# Patient Record
Sex: Male | Born: 1980 | Race: White | Hispanic: No | Marital: Single | Smoking: Former smoker
Health system: Southern US, Community
[De-identification: ages and names within clinical notes are randomized; demographics above are authoritative.]

---

## 2003-04-11 ENCOUNTER — Emergency Department (HOSPITAL_COMMUNITY): Admission: EM | Admit: 2003-04-11 | Discharge: 2003-04-11 | Payer: Self-pay | Admitting: Emergency Medicine

## 2007-04-26 ENCOUNTER — Emergency Department (HOSPITAL_COMMUNITY): Admission: EM | Admit: 2007-04-26 | Discharge: 2007-04-26 | Payer: Self-pay | Admitting: Emergency Medicine

## 2014-09-16 ENCOUNTER — Encounter (HOSPITAL_COMMUNITY): Payer: Self-pay | Admitting: *Deleted

## 2014-09-16 ENCOUNTER — Emergency Department (HOSPITAL_COMMUNITY)
Admission: EM | Admit: 2014-09-16 | Discharge: 2014-09-16 | Disposition: A | Discharge status: Home / Self Care | Payer: Self-pay | Attending: Emergency Medicine | Admitting: Emergency Medicine

## 2014-09-16 DIAGNOSIS — T1490XA Injury, unspecified, initial encounter: Secondary | ICD-10-CM

## 2014-09-16 DIAGNOSIS — Y998 Other external cause status: Secondary | ICD-10-CM | POA: Insufficient documentation

## 2014-09-16 DIAGNOSIS — Y92410 Unspecified street and highway as the place of occurrence of the external cause: Secondary | ICD-10-CM | POA: Insufficient documentation

## 2014-09-16 DIAGNOSIS — S24109A Unspecified injury at unspecified level of thoracic spinal cord, initial encounter: Secondary | ICD-10-CM | POA: Insufficient documentation

## 2014-09-16 DIAGNOSIS — Y9389 Activity, other specified: Secondary | ICD-10-CM | POA: Insufficient documentation

## 2014-09-16 DIAGNOSIS — S29001A Unspecified injury of muscle and tendon of front wall of thorax, initial encounter: Secondary | ICD-10-CM | POA: Insufficient documentation

## 2014-09-16 DIAGNOSIS — S3992XA Unspecified injury of lower back, initial encounter: Secondary | ICD-10-CM | POA: Insufficient documentation

## 2014-09-16 DIAGNOSIS — F1010 Alcohol abuse, uncomplicated: Secondary | ICD-10-CM | POA: Insufficient documentation

## 2014-09-16 LAB — CBC WITH DIFFERENTIAL/PLATELET
Basophils Absolute: 0 10*3/uL (ref 0.0–0.1)
Basophils Relative: 0 % (ref 0–1)
EOS ABS: 0.1 10*3/uL (ref 0.0–0.7)
Eosinophils Relative: 2 % (ref 0–5)
HEMATOCRIT: 46 % (ref 39.0–52.0)
HEMOGLOBIN: 16.3 g/dL (ref 13.0–17.0)
LYMPHS PCT: 30 % (ref 12–46)
Lymphs Abs: 2.2 10*3/uL (ref 0.7–4.0)
MCH: 32 pg (ref 26.0–34.0)
MCHC: 35.4 g/dL (ref 30.0–36.0)
MCV: 90.4 fL (ref 78.0–100.0)
Monocytes Absolute: 0.4 10*3/uL (ref 0.1–1.0)
Monocytes Relative: 5 % (ref 3–12)
NEUTROS ABS: 4.7 10*3/uL (ref 1.7–7.7)
NEUTROS PCT: 63 % (ref 43–77)
PLATELETS: 241 10*3/uL (ref 150–400)
RBC: 5.09 MIL/uL (ref 4.22–5.81)
RDW: 12.1 % (ref 11.5–15.5)
WBC: 7.5 10*3/uL (ref 4.0–10.5)

## 2014-09-16 LAB — I-STAT CHEM 8, ED
BUN: 10 mg/dL (ref 6–23)
CHLORIDE: 105 meq/L (ref 96–112)
Calcium, Ion: 1.15 mmol/L (ref 1.12–1.23)
Creatinine, Ser: 1.3 mg/dL (ref 0.50–1.35)
Glucose, Bld: 107 mg/dL — ABNORMAL HIGH (ref 70–99)
HCT: 50 % (ref 39.0–52.0)
HEMOGLOBIN: 17 g/dL (ref 13.0–17.0)
POTASSIUM: 3.8 mmol/L (ref 3.5–5.1)
Sodium: 141 mmol/L (ref 135–145)
TCO2: 20 mmol/L (ref 0–100)

## 2014-09-16 LAB — ETHANOL: ALCOHOL ETHYL (B): 281 mg/dL — AB (ref 0–9)
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
## 2014-09-16 NOTE — ED Notes (Signed)
CT called RN to see if IV had been started. Pt. Still on phone with dad and would not get off phone to get undressed so RN could start IV. RN asked pt. To undress again.
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
## 2014-09-16 NOTE — ED Notes (Addendum)
[employer] banker, operationaltate Police Officer at bedside.

## 2014-09-16 NOTE — ED Notes (Signed)
EMT helped pt. Get undressed and into gown. RN went into pt. Room to start IV for CT. Pt. Was dressed and wanted to leave AMA. RN informed MD and MD convinced pt. To stay. Pt. Wanted to call dad. RN assisted pt. With phone call.

## 2014-09-16 NOTE — ED Notes (Signed)
Pt. Left with all belongings and refused wheelchair 

## 2017-05-27 ENCOUNTER — Encounter (HOSPITAL_BASED_OUTPATIENT_CLINIC_OR_DEPARTMENT_OTHER): Payer: Self-pay | Admitting: Emergency Medicine

## 2017-05-27 ENCOUNTER — Emergency Department (HOSPITAL_BASED_OUTPATIENT_CLINIC_OR_DEPARTMENT_OTHER)
Admission: EM | Admit: 2017-05-27 | Discharge: 2017-05-27 | Disposition: A | Discharge status: Home / Self Care | Payer: Self-pay | Attending: Emergency Medicine | Admitting: Emergency Medicine

## 2017-05-27 DIAGNOSIS — M5412 Radiculopathy, cervical region: Secondary | ICD-10-CM | POA: Insufficient documentation

## 2017-05-27 MED ORDER — PREDNISONE 10 MG PO TABS: 40.0000 mg | ORAL_TABLET | Freq: Every day | ORAL | 0 refills | Status: DC

## 2017-05-27 MED ORDER — PREDNISONE 50 MG PO TABS
60.0000 mg | ORAL_TABLET | Freq: Once | ORAL | Status: AC
  Administered 2017-05-27: 60 mg via ORAL
  Filled 2017-05-27: qty 1

## 2017-05-27 NOTE — ED Provider Notes (Signed)
[facility]   CSN: [id number] Arrival date & time: 05/27/17  1634     History   Chief Complaint Chief Complaint  Patient presents with  . Numbness    HPI Austin Huang is a 36 y.o. male.  Patient presents with a bilateral numbness to both hands ring finger little finger and weakness with grip for the past 2 days. 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 Patient denies any lower extremity weakness or numbness. Denies any neck pain back pain headache. No trouble speaking no visual changes. No history of any injury. 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 Patient does talk about some numbness to that area intermittently in the past but always resolved quickly.      History reviewed. No pertinent past medical history.  There are no active problems to display for this patient.   History reviewed. No pertinent surgical history.     Home Medications    Prior to Admission medications   Medication Sig Start Date End Date Taking? Authorizing Provider  predniSONE (DELTASONE) 10 MG tablet Take 4 tablets (40 mg total) by mouth daily. 05/27/17   Vanetta Mulders, MD    Family History No family history on file.  Social History Social History  Substance Use Topics  . Smoking status: Former Games developer  . Smokeless tobacco: Never Used  . Alcohol use Yes     Allergies   Patient has no known allergies.   Review of Systems Review of Systems  Constitutional: Negative for fever.  HENT: Negative for congestion.   Eyes: Negative for visual disturbance.  Respiratory: Negative for shortness of breath.   Cardiovascular: Negative for chest pain.  Gastrointestinal: Negative for abdominal pain.  Genitourinary: Negative for difficulty urinating.  Musculoskeletal: Negative for back pain and neck pain.  Skin: Negative for rash.  Neurological: Positive for weakness and numbness. Negative for facial asymmetry,  speech difficulty and headaches.  Psychiatric/Behavioral: Negative for confusion.     Physical Exam Updated Vital Signs BP 127/88 (BP Location: Left Arm)   Pulse 86   Temp 98.4 F (36.9 C) (Oral)   Resp 18   SpO2 98%   Physical Exam  Constitutional: He is oriented to person, place, and time. He appears well-developed and well-nourished. No distress.  HENT:  Head: Normocephalic and atraumatic.  Mouth/Throat: Oropharynx is clear and moist.  Eyes: Pupils are equal, round, and reactive to light. Conjunctivae and EOM are normal.  Neck: Normal range of motion. Neck supple.  Cardiovascular: Normal rate, regular rhythm and normal heart sounds.   Pulmonary/Chest: Breath sounds normal. No respiratory distress.  Abdominal: Soft. Bowel sounds are normal. There is no tenderness.  Musculoskeletal: Normal range of motion. He exhibits no edema.  Neurological: He is alert and oriented to person, place, and time. A sensory deficit is present. He exhibits abnormal muscle tone.  Patient with numbness to little finger and ring finger in both hands. Some decrease grip strength with those fingers. Patient also has a residual right hand median nerve numbness and some weakness. Radial pulses 2+ bilaterally good cap refill.  Skin: Skin is warm. No rash noted.  Nursing note and vitals reviewed.    ED Treatments / Results  Labs (all labs ordered are listed, but only abnormal results are displayed) Labs Reviewed - No data to display  EKG  EKG Interpretation None       Radiology No results found.  Procedures Procedures (including critical care  time)  Medications Ordered in ED Medications  predniSONE (DELTASONE) tablet 60 mg (60 mg Oral Given 05/27/17 1744)     Initial Impression / Assessment and Plan / ED Course  I have reviewed the triage vital signs and the nursing notes.  Pertinent labs & imaging results that were available during my care of the patient were reviewed by me and  considered in my medical decision making (see chart for details).    The patient's symptoms are suggestive of a cervical radiculopathy. It is bilateral. Patient without any pain symptoms. Patient without any history of injury. Not consistent with a CVA. 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 Do not see where CT scan would be helpful since is no concern for injury. Patient also has no systemic symptoms. Patient may require MRI if symptoms do not resolve. Will treat with prednisone. Symptoms have been less than 2 weeks.   Final Clinical Impressions(s) / ED Diagnoses   Final diagnoses:  Cervical radiculopathy    New Prescriptions New Prescriptions   PREDNISONE (DELTASONE) 10 MG TABLET    Take 4 tablets (40 mg total) by mouth daily.     Vanetta Mulders, MD 05/27/17 [phone]

## 2017-05-27 NOTE — ED Notes (Signed)
Pt given rx x 1 for prednisone 

## 2017-05-27 NOTE — ED Triage Notes (Addendum)
Bilateral hand numbness/weakness x 3 days. Pt with hx of nerve damage to R forearm but states this is different. He is having difficulty making a fist or gripping. Denies recent injury. Pt denies weakness in legs, speech clear.

## 2017-05-27 NOTE — Discharge Instructions (Signed)
Follow-up for new or worse symptoms or if symptoms are not resolved in 2 weeks. Take prednisone as directed for the next 5 days. Would avoid operating any heavy equipment until symptoms improved.

## 2019-08-27 ENCOUNTER — Emergency Department (HOSPITAL_BASED_OUTPATIENT_CLINIC_OR_DEPARTMENT_OTHER)
Admission: EM | Admit: 2019-08-27 | Discharge: 2019-08-27 | Disposition: A | Discharge status: Home / Self Care | Payer: Self-pay | Attending: Emergency Medicine | Admitting: Emergency Medicine

## 2019-08-27 ENCOUNTER — Other Ambulatory Visit: Payer: Self-pay

## 2019-08-27 ENCOUNTER — Encounter (HOSPITAL_BASED_OUTPATIENT_CLINIC_OR_DEPARTMENT_OTHER): Payer: Self-pay | Admitting: Emergency Medicine

## 2019-08-27 DIAGNOSIS — Z87891 Personal history of nicotine dependence: Secondary | ICD-10-CM | POA: Insufficient documentation

## 2019-08-27 DIAGNOSIS — S61257A Open bite of left little finger without damage to nail, initial encounter: Secondary | ICD-10-CM | POA: Insufficient documentation

## 2019-08-27 DIAGNOSIS — Z23 Encounter for immunization: Secondary | ICD-10-CM | POA: Insufficient documentation

## 2019-08-27 DIAGNOSIS — W5581XA Bitten by other mammals, initial encounter: Secondary | ICD-10-CM | POA: Insufficient documentation

## 2019-08-27 DIAGNOSIS — Z2914 Encounter for prophylactic rabies immune globin: Secondary | ICD-10-CM | POA: Insufficient documentation

## 2019-08-27 DIAGNOSIS — T148XXA Other injury of unspecified body region, initial encounter: Secondary | ICD-10-CM

## 2019-08-27 DIAGNOSIS — Y999 Unspecified external cause status: Secondary | ICD-10-CM | POA: Insufficient documentation

## 2019-08-27 DIAGNOSIS — Y929 Unspecified place or not applicable: Secondary | ICD-10-CM | POA: Insufficient documentation

## 2019-08-27 DIAGNOSIS — Y939 Activity, unspecified: Secondary | ICD-10-CM | POA: Insufficient documentation

## 2019-08-27 MED ORDER — RABIES VACCINE, PCEC IM SUSR
1.0000 mL | Freq: Once | INTRAMUSCULAR | Status: AC
  Administered 2019-08-27: 1 mL via INTRAMUSCULAR
  Filled 2019-08-27: qty 1

## 2019-08-27 MED ORDER — TETANUS-DIPHTH-ACELL PERTUSSIS 5-2.5-18.5 LF-MCG/0.5 IM SUSP
0.5000 mL | Freq: Once | INTRAMUSCULAR | Status: AC
  Administered 2019-08-27: 0.5 mL via INTRAMUSCULAR
  Filled 2019-08-27: qty 0.5

## 2019-08-27 MED ORDER — AMOXICILLIN-POT CLAVULANATE 875-125 MG PO TABS: 1.0000 | ORAL_TABLET | Freq: Two times a day (BID) | ORAL | 0 refills | Status: DC

## 2019-08-27 MED ORDER — AMOXICILLIN-POT CLAVULANATE 875-125 MG PO TABS
1.0000 | ORAL_TABLET | Freq: Once | ORAL | Status: AC
  Administered 2019-08-27: 1 via ORAL
  Filled 2019-08-27: qty 1

## 2019-08-27 MED ORDER — RABIES IMMUNE GLOBULIN 150 UNIT/ML IM INJ
20.0000 [IU]/kg | INJECTION | Freq: Once | INTRAMUSCULAR | Status: AC
  Administered 2019-08-27: 1425 [IU] via INTRAMUSCULAR
  Filled 2019-08-27: qty 10

## 2019-08-27 NOTE — Discharge Instructions (Addendum)
Take Augmentin as prescribed until completed.  You will need to return in 2 days for wound recheck.  If you are developing increasing pain, swelling, redness, red streaking back your hand, fevers, you will need to return sooner.  Please have a low threshold to return for recheck as hand infections can get worse very quickly.  You will need to go to urgent care for subsequent rabies vaccinations.  Please see attached letter for dates and locations.

## 2019-08-27 NOTE — ED Notes (Signed)
ED Provider at bedside. 

## 2019-08-27 NOTE — ED Triage Notes (Signed)
Opossum bite to L pinky finger on Tuesday.

## 2019-08-27 NOTE — ED Provider Notes (Signed)
[facility] Provider Note   CSN: [id number] Arrival date & time: 08/27/19  1417     History Chief Complaint  Patient presents with  . Animal Bite    Austin Huang is a 38 y.o. male who is previously healthy who presents with animal bite to left pinky finger.  He reports this happened 6 days ago.  He reports he caught a opossum and a live trap in his backyard.  He reports he tried to pet it and it bit him.  He presents today because it is starting to get red and tracked back his finger.  He has some mild pain associated with it.  He reports he washed it for a few days in the trap and it was acting normal, so he let it go.  His tetanus is not up-to-date.  He is able to move his finger without significant difficulty, but does have little swelling to the finger.  He denies any other associated symptoms.  HPI     History reviewed. No pertinent past medical history.  There are no problems to display for this patient.   History reviewed. No pertinent surgical history.     No family history on file.  Social History   Tobacco Use  . Smoking status: Former Research scientist (life sciences)  . Smokeless tobacco: Never Used  Substance Use Topics  . Alcohol use: Yes  . Drug use: No    Home Medications Prior to Admission medications   Medication Sig Start Date End Date Taking? Authorizing Provider  amoxicillin-clavulanate (AUGMENTIN) 875-125 MG tablet Take 1 tablet by mouth every 12 (twelve) hours. 08/27/19   Adynn Caseres, Bea Graff, PA-C  predniSONE (DELTASONE) 10 MG tablet Take 4 tablets (40 mg total) by mouth daily. 05/27/17   Fredia Sorrow, MD    Allergies    Patient has no known allergies.  Review of Systems   Review of Systems  Constitutional: Negative for fever.  Musculoskeletal: Positive for joint swelling.  Skin: Positive for color change.    Physical Exam Updated Vital Signs BP 133/85 (BP Location: Right Arm)   Pulse 65   Temp 98.7 F (37.1 C) (Oral)    Resp 20   Ht 5\' 10"  (1.778 m)   Wt 72.6 kg   SpO2 100%   BMI 22.96 kg/m   Physical Exam Vitals and nursing note reviewed.  Constitutional:      General: He is not in acute distress.    Appearance: He is well-developed. He is not diaphoretic.  HENT:     Head: Normocephalic and atraumatic.     Mouth/Throat:     Pharynx: No oropharyngeal exudate.  Eyes:     General: No scleral icterus.       Right eye: No discharge.        Left eye: No discharge.     Conjunctiva/sclera: Conjunctivae normal.     Pupils: Pupils are equal, round, and reactive to light.  Neck:     Thyroid: No thyromegaly.  Cardiovascular:     Rate and Rhythm: Normal rate and regular rhythm.     Heart sounds: Normal heart sounds. No murmur. No friction rub. No gallop.   Pulmonary:     Effort: Pulmonary effort is normal. No respiratory distress.     Breath sounds: Normal breath sounds. No stridor. No wheezing or rales.  Musculoskeletal:     Cervical back: Normal range of motion and neck supple.     Comments: Mild swelling to left fifth finger  with 2, scabbing puncture marks to the finger pad and erythema tracking on the ulnar aspect and stops at the PIP; range of motion is intact and can make a fist without difficulty, no bony tenderness, sensation intact, cap refill less than 2 seconds; see photos  Lymphadenopathy:     Cervical: No cervical adenopathy.  Skin:    General: Skin is warm and dry.     Coloration: Skin is not pale.     Findings: No rash.  Neurological:     Mental Status: He is alert.     Coordination: Coordination normal.         ED Results / Procedures / Treatments   Labs (all labs ordered are listed, but only abnormal results are displayed) Labs Reviewed - No data to display  EKG None  Radiology No results found.  Procedures Procedures (including critical care time)  Medications Ordered in ED Medications  rabies immune globulin (HYPERAB/KEDRAB) injection 1,425 Units (1,425 Units  Intramuscular Given 08/27/19 1544)  rabies vaccine (RABAVERT) injection 1 mL (1 mL Intramuscular Given 08/27/19 1540)  Tdap (BOOSTRIX) injection 0.5 mL (0.5 mLs Intramuscular Given 08/27/19 1537)  amoxicillin-clavulanate (AUGMENTIN) 875-125 MG per tablet 1 tablet (1 tablet Oral Given 08/27/19 1536)    ED Course  I have reviewed the triage vital signs and the nursing notes.  Pertinent labs & imaging results that were available during my care of the patient were reviewed by me and considered in my medical decision making (see chart for details).    MDM Rules/Calculators/A&P                      Patient presenting with opossum bite to left fifth digit.  Tetanus is not up-to-date, so booster given.  There are early signs of cellulitis.  Rabies immunoglobulin and vaccine given.  Will cover with Augmentin.  Patient encouraged to return in 2 days for recheck and to return sooner if increasing redness is spreading up the hand, fever, significant swelling and difficulty making a fist.  Patient understands and agrees with plan.  Patient vitals stable and discharged in satisfactory condition.  Final Clinical Impression(s) / ED Diagnoses Final diagnoses:  Animal bite    Rx / DC Orders ED Discharge Orders         Ordered    amoxicillin-clavulanate (AUGMENTIN) 875-125 MG tablet  Every 12 hours     08/27/19 [address] PA-C 08/27/19 1650    Geoffery Lyons, MD 08/30/19 1506

## 2019-08-30 ENCOUNTER — Ambulatory Visit (HOSPITAL_COMMUNITY)
Admission: EM | Admit: 2019-08-30 | Discharge: 2019-08-30 | Disposition: A | Discharge status: Home / Self Care | Payer: Medicaid Other | Attending: Family Medicine | Admitting: Family Medicine

## 2019-08-30 ENCOUNTER — Encounter (HOSPITAL_COMMUNITY): Payer: Self-pay

## 2019-08-30 ENCOUNTER — Other Ambulatory Visit: Payer: Self-pay

## 2019-08-30 DIAGNOSIS — Z203 Contact with and (suspected) exposure to rabies: Secondary | ICD-10-CM

## 2019-08-30 DIAGNOSIS — Z23 Encounter for immunization: Secondary | ICD-10-CM

## 2019-08-30 MED ORDER — RABIES VACCINE, PCEC IM SUSR
INTRAMUSCULAR | Status: AC
  Filled 2019-08-30: qty 1

## 2019-08-30 MED ORDER — RABIES VACCINE, PCEC IM SUSR
1.0000 mL | Freq: Once | INTRAMUSCULAR | Status: AC
  Administered 2019-08-30: 14:00:00 1 mL via INTRAMUSCULAR

## 2019-08-30 NOTE — ED Triage Notes (Signed)
Pt here for day 3 of rabies injection, given in right deltoid.

## 2019-09-03 ENCOUNTER — Ambulatory Visit (HOSPITAL_COMMUNITY)
Admission: EM | Admit: 2019-09-03 | Discharge: 2019-09-03 | Disposition: A | Discharge status: Home / Self Care | Payer: Medicaid Other | Attending: Emergency Medicine | Admitting: Emergency Medicine

## 2019-09-03 ENCOUNTER — Other Ambulatory Visit: Payer: Self-pay

## 2019-09-03 DIAGNOSIS — Z203 Contact with and (suspected) exposure to rabies: Secondary | ICD-10-CM

## 2019-09-03 DIAGNOSIS — Z23 Encounter for immunization: Secondary | ICD-10-CM

## 2019-09-03 MED ORDER — RABIES VACCINE, PCEC IM SUSR
INTRAMUSCULAR | Status: AC
  Filled 2019-09-03: qty 1

## 2019-09-03 MED ORDER — RABIES VACCINE, PCEC IM SUSR
1.0000 mL | Freq: Once | INTRAMUSCULAR | Status: AC
  Administered 2019-09-03: 1 mL via INTRAMUSCULAR

## 2019-09-03 NOTE — ED Triage Notes (Signed)
Pt presents for day 7 of rabies injection in left deltoid.

## 2019-09-13 ENCOUNTER — Ambulatory Visit (HOSPITAL_COMMUNITY)
Admission: EM | Admit: 2019-09-13 | Discharge: 2019-09-13 | Disposition: A | Discharge status: Home / Self Care | Payer: BLUE CROSS/BLUE SHIELD | Attending: Family Medicine | Admitting: Family Medicine

## 2019-09-13 ENCOUNTER — Other Ambulatory Visit: Payer: Self-pay

## 2019-09-13 DIAGNOSIS — S61257D Open bite of left little finger without damage to nail, subsequent encounter: Secondary | ICD-10-CM

## 2019-09-13 DIAGNOSIS — Z203 Contact with and (suspected) exposure to rabies: Secondary | ICD-10-CM | POA: Diagnosis not present

## 2019-09-13 MED ORDER — RABIES VACCINE, PCEC IM SUSR
1.0000 mL | Freq: Once | INTRAMUSCULAR | Status: AC
  Administered 2019-09-13: 14:00:00 1 mL via INTRAMUSCULAR

## 2019-09-13 MED ORDER — RABIES VACCINE, PCEC IM SUSR
INTRAMUSCULAR | Status: AC
  Filled 2019-09-13: qty 1

## 2019-09-13 NOTE — ED Triage Notes (Signed)
Pt here for day 14 rabies vaccine.  Pt denies any issues. 

## 2020-09-19 ENCOUNTER — Ambulatory Visit (HOSPITAL_COMMUNITY)
Admission: EM | Admit: 2020-09-19 | Discharge: 2020-09-19 | Disposition: A | Discharge status: Home / Self Care | Payer: BLUE CROSS/BLUE SHIELD | Attending: Family Medicine | Admitting: Family Medicine

## 2020-09-19 ENCOUNTER — Other Ambulatory Visit: Payer: Self-pay

## 2020-09-19 ENCOUNTER — Ambulatory Visit (INDEPENDENT_AMBULATORY_CARE_PROVIDER_SITE_OTHER): Payer: BLUE CROSS/BLUE SHIELD

## 2020-09-19 ENCOUNTER — Encounter (HOSPITAL_COMMUNITY): Payer: Self-pay

## 2020-09-19 DIAGNOSIS — M25531 Pain in right wrist: Secondary | ICD-10-CM | POA: Diagnosis not present

## 2020-09-19 DIAGNOSIS — R030 Elevated blood-pressure reading, without diagnosis of hypertension: Secondary | ICD-10-CM

## 2020-09-19 MED ORDER — PREDNISONE 10 MG (21) PO TBPK: ORAL_TABLET | Freq: Every day | ORAL | 0 refills | Status: AC

## 2020-09-19 NOTE — ED Triage Notes (Signed)
Pt in with c/o right wrist pain that has been going on for a few weeks now  Denies any recent injury to wrist  Pt has been taking advil for pain with no relief

## 2020-09-19 NOTE — Discharge Instructions (Signed)
Wear brace Use ice Take the prednisone as directed Follow up with sport medicine  Need to get BP re checked

## 2020-09-19 NOTE — ED Provider Notes (Signed)
[facility]    CSN: [id number] Arrival date & time: 09/19/20  1515      History   Chief Complaint Chief Complaint  Patient presents with  . Wrist Pain    HPI Austin Huang is a 40 y.o. male.   HPI  Works in the Teaching laboratory technician.  States that he uses both arms and hands repetitively throughout his work shift.  He has been having pain in his right wrist for the last few weeks.  Is getting worse.  It is on the outside of his wrist (points to ulnar styloid).  It hurts with pronation supination and with torquing on a wrench. Patient has no injury to his right upper extremity.  He had an injury to his elbow that cut the radial nerve at the elbow.  He went to the emergency room that day and was told that it was nerve damage that was unable to be fixed.  He states that the nerve had been severed completely and that both ends have retracted making it impossible to fix it.  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  Limited grip strength.  Limited dexterity.  He does have atrophy.  Lack of sensation.  He states because of this he has a different method of using this hand in order to get his work done. Denies any recent trauma or fall  History reviewed. No pertinent past medical history.  There are no problems to display for this patient.   History reviewed. No pertinent surgical history.     Home Medications    Prior to Admission medications   Medication Sig Start Date End Date Taking? Authorizing Provider  predniSONE (STERAPRED UNI-PAK 21 TAB) 10 MG (21) TBPK tablet Take by mouth daily. tad 09/19/20  Yes Eustace Moore, MD    Family History Family History  Problem Relation Age of Onset  . Healthy Mother   . Healthy Father     Social History Social History   Tobacco Use  . Smoking status: Former Games developer  . Smokeless tobacco: Never Used  Vaping Use  . Vaping Use: Never used  Substance Use Topics  . Alcohol use: Not  Currently  . Drug use: No     Allergies   Patient has no known allergies.   Review of Systems Review of Systems See HPI  Physical Exam Triage Vital Signs ED Triage Vitals [09/19/20 1611]  Enc Vitals Group     BP (!) 159/100     Pulse Rate 66     Resp 16     Temp 98.1 F (36.7 C)     Temp Source Oral     SpO2 98 %     Weight      Height      Head Circumference      Peak Flow      Pain Score 2     Pain Loc      Pain Edu?      Excl. in GC?    No data found.  Updated Vital Signs BP (!) 159/100 (BP Location: Right Arm)   Pulse 66   Temp 98.1 F (36.7 C) (Oral)   Resp 16   SpO2 98%     Physical Exam Constitutional:      General: He is not in acute distress.    Appearance: He is well-developed and well-nourished.  HENT:     Head: Normocephalic and atraumatic.  Mouth/Throat:     Mouth: Oropharynx is clear and moist.     Comments: Mask in place Eyes:     Conjunctiva/sclera: Conjunctivae normal.     Pupils: Pupils are equal, round, and reactive to light.  Cardiovascular:     Rate and Rhythm: Normal rate.  Pulmonary:     Effort: Pulmonary effort is normal. No respiratory distress.  Abdominal:     General: There is no distension.     Palpations: Abdomen is soft.  Musculoskeletal:        General: No edema. Normal range of motion.     Cervical back: Normal range of motion.     Comments: Patient's right upper extremity shows scarring in the antecubital fossa's towards the medial side from old injury.  His hand has obvious deformity of the thumb index and long finger.  The index finger is atrophic and has limited range of motion.  The thumb has an atrophic thenar eminence.  Grip strength is weakened on the radial side.  There is tenderness right over the ulnar styloid and pain with pronation supination more than extension flexion.  Skin:    General: Skin is warm and dry.  Neurological:     Mental Status: He is alert. Mental status is at baseline.  Psychiatric:         Mood and Affect: Mood normal.        Behavior: Behavior normal.      UC Treatments / Results  Labs (all labs ordered are listed, but only abnormal results are displayed) Labs Reviewed - No data to display  EKG   Radiology DG Wrist Complete Right  Result Date: 09/19/2020 CLINICAL DATA:  Pain. EXAM: RIGHT WRIST - COMPLETE 3+ VIEW COMPARISON:  None. FINDINGS: There is no evidence of fracture or dislocation. There is no evidence of arthropathy or other focal bone abnormality. Soft tissues are unremarkable. IMPRESSION: Negative. Electronically Signed   By: Katherine Mantle M.D.   On: 09/19/2020 17:28    Procedures Procedures (including critical care time)  Medications Ordered in UC Medications - No data to display  Initial Impression / Assessment and Plan / UC Course  I have reviewed the triage vital signs and the nursing notes.  Pertinent labs & imaging results that were available during my care of the patient were reviewed by me and considered in my medical decision making (see chart for details).     Put patient in a brace but he can still pronate and supinate the wrist which increases his pain.  We will have him follow-up with sports medicine.  We will do a prednisone pack and ice to try to reduce the inflammation.  Patient works for himself and states that he can limit the heavy use of the hand without a note Final Clinical Impressions(s) / UC Diagnoses   Final diagnoses:  Right wrist pain  Elevated blood pressure reading     Discharge Instructions     Wear brace Use ice Take the prednisone as directed Follow up with sport medicine  Need to get BP re checked    ED Prescriptions    Medication Sig Dispense Auth. Provider   predniSONE (STERAPRED UNI-PAK 21 TAB) 10 MG (21) TBPK tablet Take by mouth daily. tad 21 tablet Eustace Moore, MD     PDMP not reviewed this encounter.   Eustace Moore, MD 09/19/20 [phone]

## 2020-10-01 ENCOUNTER — Ambulatory Visit: Payer: BLUE CROSS/BLUE SHIELD | Admitting: Sports Medicine

## 2021-02-26 IMAGING — DX DG WRIST COMPLETE 3+V*R*
4 series · 4 of 4 positions shown · non-contrast
Comparison: None.

CLINICAL DATA: Pain.

EXAM:
RIGHT WRIST - COMPLETE 3+ VIEW

[wrist pa]
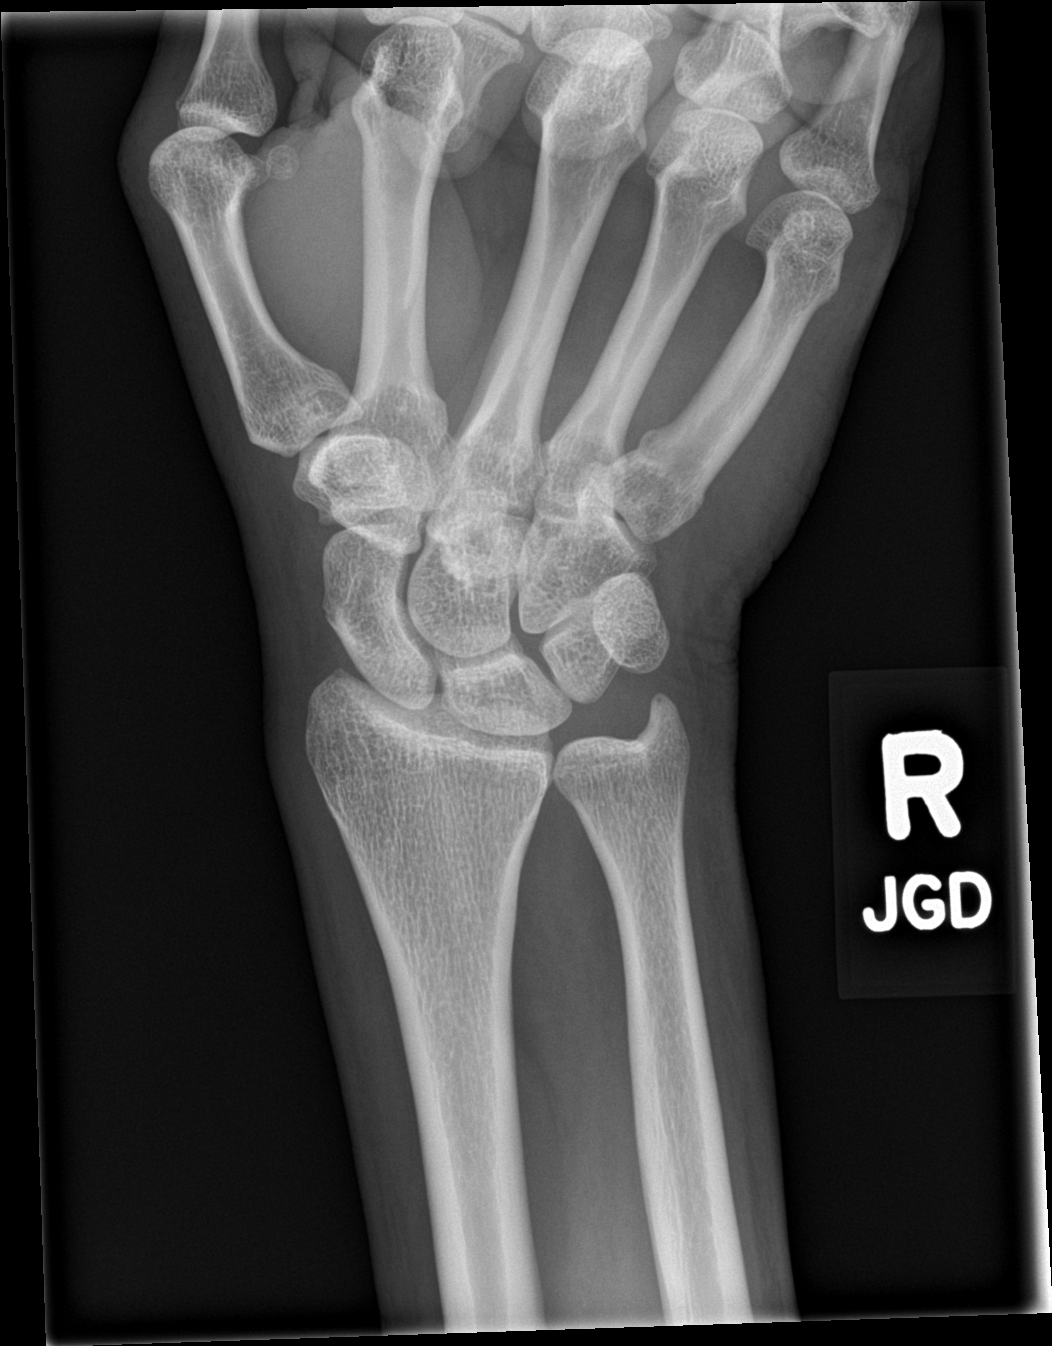

[wrist navicular]
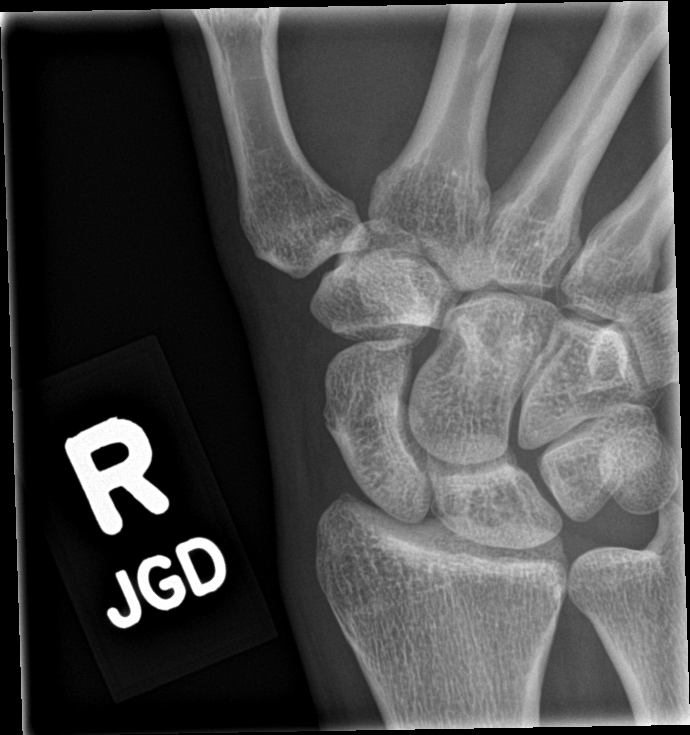

[wrist obl]
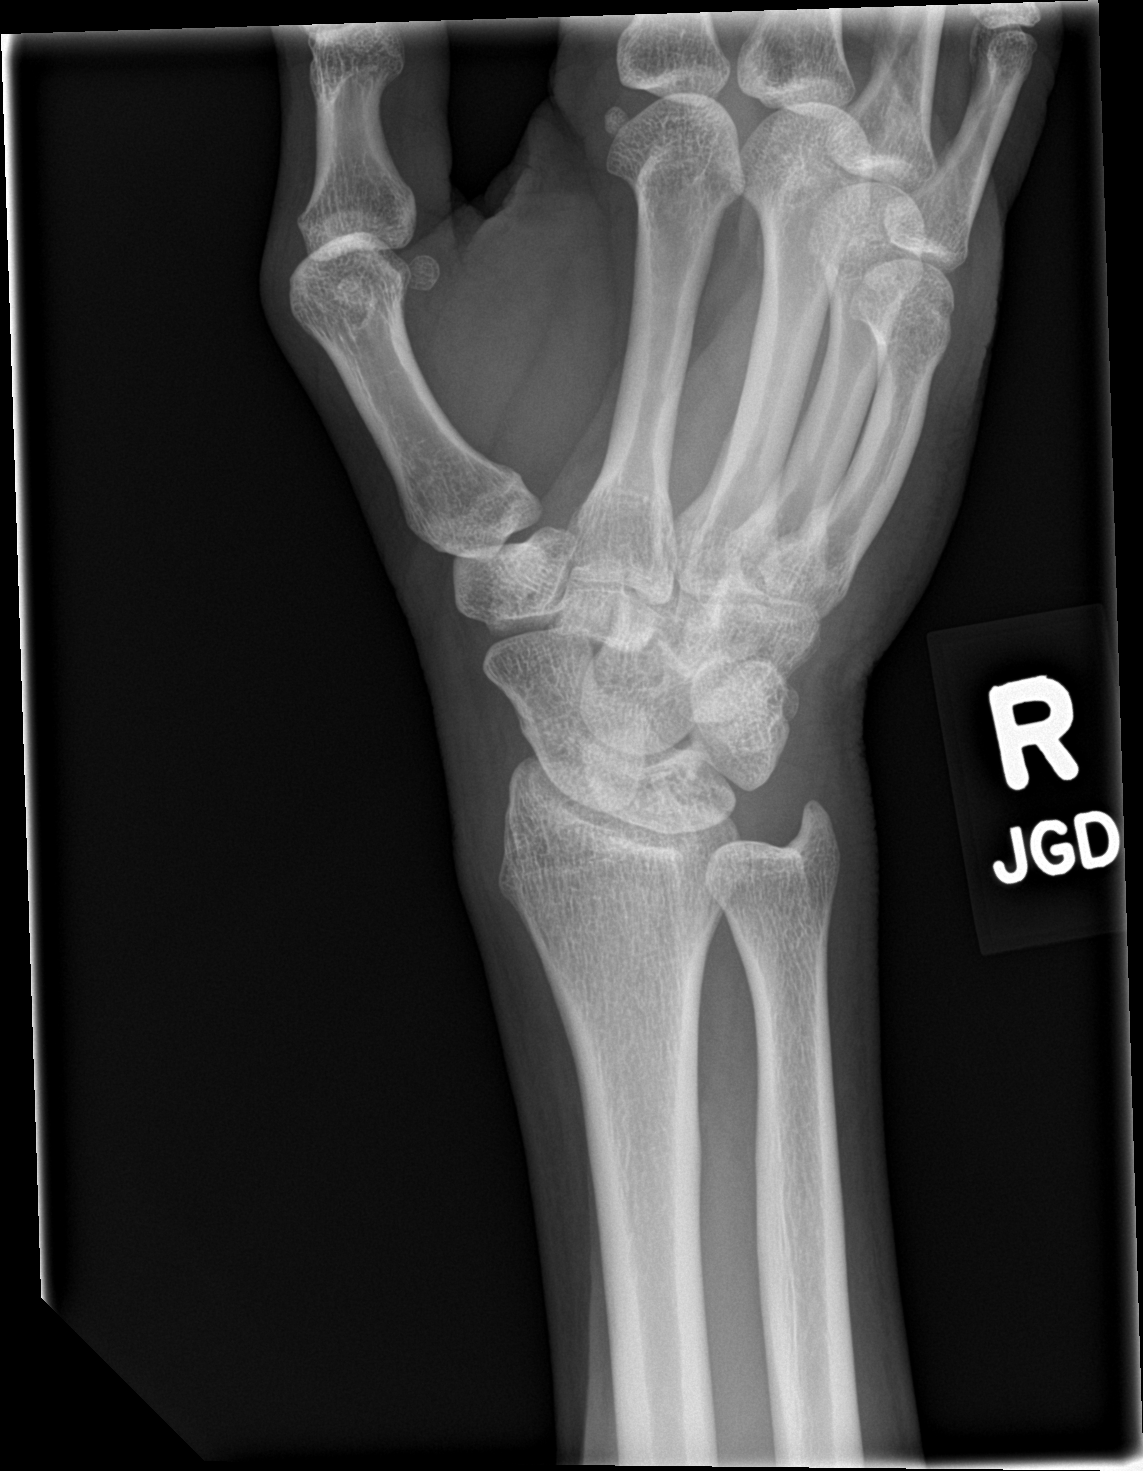

[wrist lat]
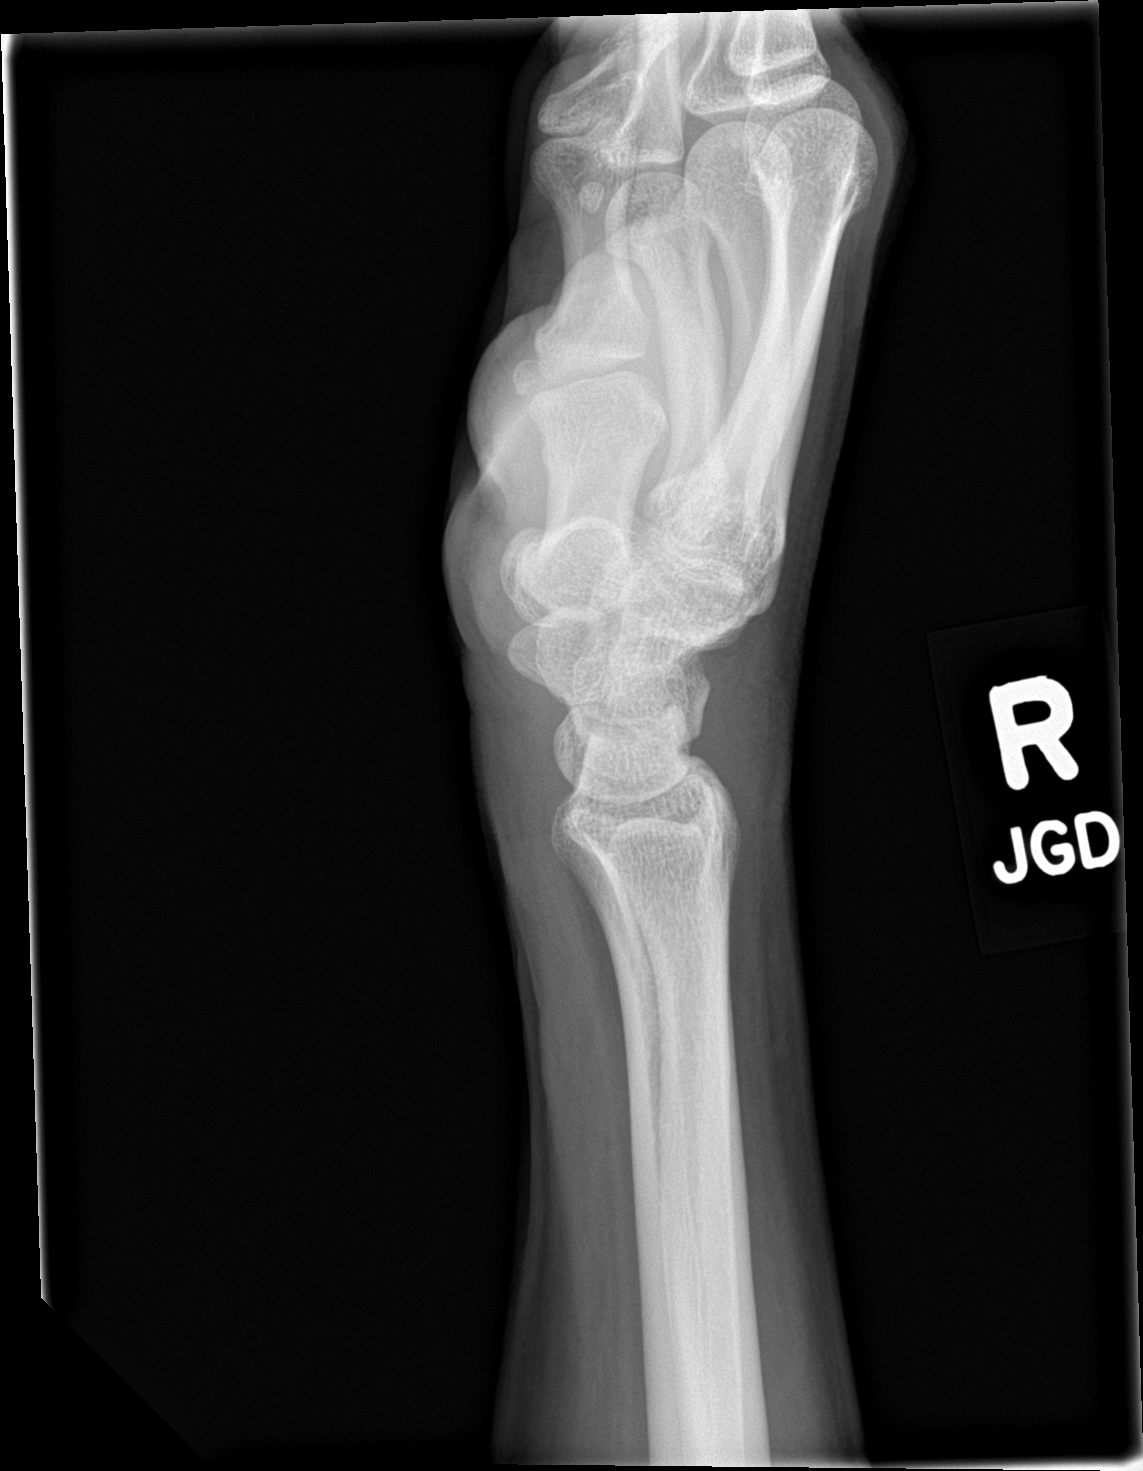

[4 of 4 positions shown; findings below may reference images not displayed]

FINDINGS: There is no evidence of fracture or dislocation. There is no
evidence of arthropathy or other focal bone abnormality. Soft
tissues are unremarkable.
IMPRESSION: Negative.

## 2023-01-20 ENCOUNTER — Telehealth: Payer: Self-pay

## 2023-01-20 NOTE — Telephone Encounter (Signed)
LVM for patient to call back [phone], or to call PCP office to schedule follow up apt. AS, CMA  

## 2023-06-25 ENCOUNTER — Other Ambulatory Visit: Payer: Self-pay

## 2023-06-25 ENCOUNTER — Encounter: Payer: Self-pay | Admitting: Intensive Care

## 2023-06-25 ENCOUNTER — Emergency Department
Admission: EM | Admit: 2023-06-25 | Discharge: 2023-06-25 | Disposition: A | Discharge status: Home / Self Care | Payer: BLUE CROSS/BLUE SHIELD | Attending: Emergency Medicine | Admitting: Emergency Medicine

## 2023-06-25 ENCOUNTER — Emergency Department: Payer: BLUE CROSS/BLUE SHIELD

## 2023-06-25 DIAGNOSIS — W228XXA Striking against or struck by other objects, initial encounter: Secondary | ICD-10-CM | POA: Diagnosis not present

## 2023-06-25 DIAGNOSIS — S61412A Laceration without foreign body of left hand, initial encounter: Secondary | ICD-10-CM | POA: Diagnosis not present

## 2023-06-25 DIAGNOSIS — S6992XA Unspecified injury of left wrist, hand and finger(s), initial encounter: Secondary | ICD-10-CM | POA: Diagnosis present

## 2023-06-25 DIAGNOSIS — Z23 Encounter for immunization: Secondary | ICD-10-CM | POA: Insufficient documentation

## 2023-06-25 MED ORDER — CEPHALEXIN 500 MG PO CAPS: 500.0000 mg | ORAL_CAPSULE | Freq: Four times a day (QID) | ORAL | 0 refills | Status: AC

## 2023-06-25 MED ORDER — ONDANSETRON 4 MG PO TBDP
4.0000 mg | ORAL_TABLET | Freq: Once | ORAL | Status: AC
  Administered 2023-06-25: 4 mg via ORAL
  Filled 2023-06-25: qty 1

## 2023-06-25 MED ORDER — HYDROCODONE-ACETAMINOPHEN 5-325 MG PO TABS: 1.0000 | ORAL_TABLET | Freq: Four times a day (QID) | ORAL | 0 refills | Status: AC | PRN

## 2023-06-25 MED ORDER — ONDANSETRON 4 MG PO TBDP: 4.0000 mg | ORAL_TABLET | Freq: Three times a day (TID) | ORAL | 0 refills | Status: AC | PRN

## 2023-06-25 MED ORDER — TETANUS-DIPHTH-ACELL PERTUSSIS 5-2.5-18.5 LF-MCG/0.5 IM SUSY
0.5000 mL | PREFILLED_SYRINGE | Freq: Once | INTRAMUSCULAR | Status: AC
  Administered 2023-06-25: 0.5 mL via INTRAMUSCULAR
  Filled 2023-06-25: qty 0.5

## 2023-06-25 MED ORDER — LIDOCAINE HCL (PF) 1 % IJ SOLN
5.0000 mL | Freq: Once | INTRAMUSCULAR | Status: AC
  Administered 2023-06-25: 5 mL
  Filled 2023-06-25: qty 5

## 2023-06-25 MED ORDER — HYDROCODONE-ACETAMINOPHEN 5-325 MG PO TABS
1.0000 | ORAL_TABLET | Freq: Once | ORAL | Status: AC
  Administered 2023-06-25: 1 via ORAL
  Filled 2023-06-25: qty 1

## 2023-06-25 NOTE — ED Triage Notes (Signed)
Patient presents with laceration to left hand

## 2023-06-25 NOTE — ED Provider Notes (Signed)
[facility] Emergency Department Provider Note     Event Date/Time   First MD Initiated Contact with Patient 06/25/23 1214     (approximate)   History   Laceration   HPI  Austin Huang is Huang 42 y.o. male presents to the ED with laceration to his left hand.  Patient reports he was trying to start Huang car motor when his hand slipped and hit Huang bolt. He sustained Huang laceration between his index and middle finger knuckle. Bleeding is controlled. Denies numbness.      Physical Exam   Triage Vital Signs: ED Triage Vitals  Encounter Vitals Group     BP 06/25/23 1207 (!) 138/98     Systolic BP Percentile --      Diastolic BP Percentile --      Pulse Rate 06/25/23 1207 (!) 103     Resp 06/25/23 1207 18     Temp 06/25/23 1205 98.3 F (36.8 C)     Temp Source 06/25/23 1205 Oral     SpO2 06/25/23 1207 100 %     Weight 06/25/23 1205 180 lb (81.6 kg)     Height 06/25/23 1205 5\' 9"  (1.753 m)     Head Circumference --      Peak Flow --      Pain Score 06/25/23 1205 10     Pain Loc --      Pain Education --      Exclude from Growth Chart --     Most recent vital signs: Vitals:   06/25/23 1207 06/25/23 1439  BP: (!) 138/98 131/87  Pulse: (!) 103 87  Resp: 18   Temp:    SpO2: 100% 99%    General Awake, no distress.  HEENT NCAT. PERRL. EOMI. No rhinorrhea. Mucous membranes are moist.  CV:  Good peripheral perfusion.  RESP:  Normal effort.  ABD:  No distention.  Other:  Left hand reveals single linear laceration ~ 2cm between 1st and 2nd MCP joints on dorsal aspect. Neurovascular status intact.   ED Results / Procedures / Treatments   Labs (all labs ordered are listed, but only abnormal results are displayed) Labs Reviewed - No data to display  RADIOLOGY  I personally viewed and evaluated these images as part of my medical decision making, as well as reviewing the written report by the radiologist.  ED Provider Interpretation: Left hand  xray appears normal   DG Hand Complete Left  Result Date: 06/25/2023 CLINICAL DATA:  Left hand injury.  Laceration. EXAM: LEFT HAND - COMPLETE 3+ VIEW COMPARISON:  05/24/2013 hand radiograph FINDINGS: There is no evidence of fracture or dislocation. Healed fracture deformity of the distal fifth metacarpal. Huang dressing overlies the dorsal ulnar aspect of the hand, no radiopaque foreign body. IMPRESSION: 1. No acute fracture or radiopaque foreign body. 2. Healed fracture deformity of the distal fifth metacarpal. Electronically Signed   By: Narda Rutherford M.D.   On: 06/25/2023 14:00    PROCEDURES:  Critical Care performed: No  ..Laceration Repair  Date/Time: 06/25/2023 2:21 PM  Performed by: Conrad Loma, PA-C Authorized by: Conrad , PA-C   Consent:    Consent obtained:  Verbal   Consent given by:  Patient   Risks discussed:  Infection, pain, poor cosmetic result, nerve damage, vascular damage and tendon damage Anesthesia:    Anesthesia method:  Local infiltration   Local anesthetic:  Lidocaine 1% w/o epi Laceration details:    Location:  Hand   Hand location:  L hand, dorsum   Length (cm):  2   Depth (mm):  1 Exploration:    Hemostasis achieved with:  Direct pressure   Imaging obtained: x-ray     Imaging outcome: foreign body not noted   Treatment:    Area cleansed with:  Povidone-iodine   Amount of cleaning:  Standard   Irrigation solution:  Sterile saline   Irrigation method:  Syringe   Debridement:  None Skin repair:    Repair method:  Sutures   Suture size:  5-0   Suture material:  Nylon   Suture technique:  Simple interrupted   Number of sutures:  5 Approximation:    Approximation:  Close Repair type:    Repair type:  Simple Post-procedure details:    Dressing:  Non-adherent dressing   Procedure completion:  Tolerated    MEDICATIONS ORDERED IN ED: Medications  HYDROcodone-acetaminophen (NORCO/VICODIN) 5-325 MG per tablet 1 tablet (1 tablet  Oral Given 06/25/23 1236)  lidocaine (PF) (XYLOCAINE) 1 % injection 5 mL (5 mLs Infiltration Given 06/25/23 1237)  ondansetron (ZOFRAN-ODT) disintegrating tablet 4 mg (4 mg Oral Given 06/25/23 1245)  Tdap (BOOSTRIX) injection 0.5 mL (0.5 mLs Intramuscular Given 06/25/23 1431)     IMPRESSION / MDM / ASSESSMENT AND PLAN / ED COURSE  I reviewed the triage vital signs and the nursing notes.                               42 y.o. male presents to the emergency department for evaluation and treatment of acute left hand injury. See HPI for further details.   Differential diagnosis includes, but is not limited to Laceration, FB retention, tendon injury   Patient's presentation is most consistent with acute complicated illness / injury requiring diagnostic workup.  Patient is alert and oriented.  Hemodynamically stable.  Physical exam findings are as stated above. Less likely tendon injury given active range of motion with finger extension and flexion.  Please see procedure note.  Laceration repair procedure completed successfully.  Dressing applied.  Tetanus updated.  Encourage patient to return to ED or follow-up with primary care for suture removal in 7 to 10 days.  RICE therapy encouraged.  Patient is in stable condition for discharge home.  Antibiotic prescribed.  ED precautions to return to the ED for any worsening or new symptoms. Patient verbalizes understanding. All questions and concerns were addressed during ED visit.     FINAL CLINICAL IMPRESSION(S) / ED DIAGNOSES   Final diagnoses:  Laceration of left hand without foreign body, initial encounter   Rx / DC Orders   ED Discharge Orders          Ordered    HYDROcodone-acetaminophen (NORCO/VICODIN) 5-325 MG tablet  Every 6 hours PRN        06/25/23 1420    ondansetron (ZOFRAN-ODT) 4 MG disintegrating tablet  Every 8 hours PRN        06/25/23 1420    cephALEXin (KEFLEX) 500 MG capsule  4 times daily        06/25/23 1420             Note:  This document was prepared using Dragon voice recognition software and may include unintentional dictation errors.    Austin Reap A, PA-C 06/25/23 1700    Chesley Noon, MD 06/25/23 [phone]

## 2023-06-25 NOTE — Discharge Instructions (Addendum)
Follow-up with primary care or return to the ED for suture removal in 7 to 10 days.  Your tetanus was updated today.  Limit activity.  Encourage dressing changes twice daily.  Monitor laceration site closely twice a day for signs of infection including red streaks, and pus drainage and fever.   Antibiotics as directed until dose is complete.

## 2023-06-25 NOTE — ED Notes (Signed)
Hand dressing placed
